# Patient Record
Sex: Female | Born: 1960 | Race: White | Hispanic: No | Marital: Married | Smoking: Never smoker
Health system: Southern US, Community
[De-identification: ages and names within clinical notes are randomized; demographics above are authoritative.]

---

## 2015-06-11 ENCOUNTER — Emergency Department
Admission: EM | Admit: 2015-06-11 | Discharge: 2015-06-11 | Disposition: A | Discharge status: Home / Self Care | Attending: Emergency Medicine | Admitting: Emergency Medicine

## 2015-06-11 ENCOUNTER — Encounter: Payer: Self-pay | Admitting: Emergency Medicine

## 2015-06-11 ENCOUNTER — Emergency Department

## 2015-06-11 DIAGNOSIS — Y998 Other external cause status: Secondary | ICD-10-CM | POA: Insufficient documentation

## 2015-06-11 DIAGNOSIS — S8262XA Displaced fracture of lateral malleolus of left fibula, initial encounter for closed fracture: Secondary | ICD-10-CM

## 2015-06-11 DIAGNOSIS — W090XXA Fall on or from playground slide, initial encounter: Secondary | ICD-10-CM | POA: Diagnosis not present

## 2015-06-11 DIAGNOSIS — S99911A Unspecified injury of right ankle, initial encounter: Secondary | ICD-10-CM | POA: Diagnosis present

## 2015-06-11 DIAGNOSIS — Y9389 Activity, other specified: Secondary | ICD-10-CM | POA: Diagnosis not present

## 2015-06-11 DIAGNOSIS — Y9289 Other specified places as the place of occurrence of the external cause: Secondary | ICD-10-CM | POA: Insufficient documentation

## 2015-06-11 DIAGNOSIS — S8264XA Nondisplaced fracture of lateral malleolus of right fibula, initial encounter for closed fracture: Secondary | ICD-10-CM | POA: Insufficient documentation

## 2015-06-11 MED ORDER — OXYCODONE-ACETAMINOPHEN 5-325 MG PO TABS: 1.0000 | ORAL_TABLET | ORAL | Status: AC | PRN

## 2015-06-11 MED ORDER — OXYCODONE-ACETAMINOPHEN 5-325 MG PO TABS
2.0000 | ORAL_TABLET | Freq: Once | ORAL | Status: AC
  Administered 2015-06-11: 2 via ORAL
  Filled 2015-06-11: qty 2

## 2015-06-11 NOTE — ED Notes (Signed)
States she was at playground and had injury to right ankle  Positive swelling ? deformity

## 2015-06-11 NOTE — ED Provider Notes (Signed)
[facility] Department Provider Note  ____________________________________________  Time seen: Approximately 12:10 PM  I have reviewed the triage vital signs and the nursing notes.   HISTORY  Chief Complaint Ankle Pain    HPI Ariana Lambert is a 54 y.o. female who presents for evaluation of right ankle pain after falling down the slide at the playground with her grandson. Patient complains of swelling and inability to walk on the right ankle.   History reviewed. No pertinent past medical history.  There are no active problems to display for this patient.   History reviewed. No pertinent past surgical history.  Current Outpatient Rx  Name  Route  Sig  Dispense  Refill  . oxyCODONE-acetaminophen (ROXICET) 5-325 MG tablet   Oral   Take 1-2 tablets by mouth every 4 (four) hours as needed for severe pain.   15 tablet   0     Allergies Review of patient's allergies indicates not on file.  No family history on file.  Social History Social History  Substance Use Topics  . Smoking status: Never Smoker   . Smokeless tobacco: None  . Alcohol Use: No    Review of Systems Constitutional: No fever/chills Eyes: No visual changes. ENT: No sore throat. Cardiovascular: Denies chest pain. Respiratory: Denies shortness of breath. Gastrointestinal: No abdominal pain.  No nausea, no vomiting.  No diarrhea.  No constipation. Genitourinary: Negative for dysuria. Musculoskeletal: Positive for right ankle swelling Skin: Negative for rash. Neurological: Negative for headaches, focal weakness or numbness.  10-point ROS otherwise negative.  ____________________________________________   PHYSICAL EXAM:  VITAL SIGNS: ED Triage Vitals  Enc Vitals Group     BP --      Pulse --      Resp --      Temp --      Temp src --      SpO2 --      Weight --      Height --      Head Cir --      Peak Flow --      Pain Score 06/11/15 1202 9     Pain  Loc --      Pain Edu? --      Excl. in GC? --     Constitutional: Alert and oriented. Well appearing and in no acute distress. Cardiovascular: Normal rate, regular rhythm. Grossly normal heart sounds.  Good peripheral circulation. Respiratory: Normal respiratory effort.  No retractions. Lungs CTAB. Musculoskeletal: Right ankle distal neurovascularly intact with lateral edema. Increased pain with flexion and extension. Neurologic:  Normal speech and language. No gross focal neurologic deficits are appreciated. No gait instability. Skin:  Skin is warm, dry and intact. No rash noted. Psychiatric: Mood and affect are normal. Speech and behavior are normal.  ____________________________________________   LABS (all labs ordered are listed, but only abnormal results are displayed)  Labs Reviewed - No data to display ____________________________________________  RADIOLOGY  Nondisplaced fracture within the lateral malleolus with overlying soft tissue swelling. ____________________________________________   PROCEDURES  Procedure(s) performed: None  Critical Care performed: No  ____________________________________________   INITIAL IMPRESSION / ASSESSMENT AND PLAN / ED COURSE  Pertinent labs & imaging results that were available during my care of the patient were reviewed by me and considered in my medical decision making (see chart for details).  Distal fibular fracture. Rx given for Percocet 5/325 ankle splint placed along with Ace wrap and crutches. Patient follow-up [place]opedics back [place] in  [place]. Patient voices no other emergency medical complaints at this time. ____________________________________________   FINAL CLINICAL IMPRESSION(S) / ED DIAGNOSES  Final diagnoses:  Lateral malleolar fracture, left, closed, initial encounter      Evangeline Dakinharles M Beers, PA-C 06/11/15 1245  Phineas SemenGraydon Goodman, MD 06/11/15 1330

## 2015-06-11 NOTE — ED Notes (Signed)
Pt states she was playing on the slide with her grandson and felt her body kept going down the slide while her foot stayed, left ankle swelling noted, sensation intact, foot warm and dry, pt states she was able to walk on her leg and drive home from the playground

## 2015-06-11 NOTE — Discharge Instructions (Signed)
Ankle Fracture A fracture is a break in a bone. The ankle joint is made up of three bones. These include the lower (distal)sections of your lower leg bones, called the tibia and fibula, along with a bone in your foot, called the talus. Depending on how bad the break is and if more than one ankle joint bone is broken, a cast or splint is used to protect and keep your injured bone from moving while it heals. Sometimes, surgery is required to help the fracture heal properly.  There are two general types of fractures:  Stable fracture. This includes a single fracture line through one bone, with no injury to ankle ligaments. A fracture of the talus that does not have any displacement (movement of the bone on either side of the fracture line) is also stable.  Unstable fracture. This includes more than one fracture line through one or more bones in the ankle joint. It also includes fractures that have displacement of the bone on either side of the fracture line. CAUSES  A direct blow to the ankle.   Quickly and severely twisting your ankle.  Trauma, such as a car accident or falling from a significant height. RISK FACTORS You may be at a higher risk of ankle fracture if:  You have certain medical conditions.  You are involved in high-impact sports.  You are involved in a high-impact car accident. SIGNS AND SYMPTOMS   Tender and swollen ankle.  Bruising around the injured ankle.  Pain on movement of the ankle.  Difficulty walking or putting weight on the ankle.  A cold foot below the site of the ankle injury. This can occur if the blood vessels passing through your injured ankle were also damaged.  Numbness in the foot below the site of the ankle injury. DIAGNOSIS  An ankle fracture is usually diagnosed with a physical exam and X-rays. A CT scan may also be required for complex fractures. TREATMENT  Stable fractures are treated with a cast or splint and using crutches to avoid putting  weight on your injured ankle. This is followed by an ankle strengthening program. Some patients require a special type of cast, depending on other medical problems they may have. Unstable fractures require surgery to ensure the bones heal properly. Your health care provider will tell you what type of fracture you have and the best treatment for your condition. HOME CARE INSTRUCTIONS   Review correct crutch use with your health care provider and use your crutches as directed. Safe use of crutches is extremely important. Misuse of crutches can cause you to fall or cause injury to nerves in your hands or armpits.  Do not put weight or pressure on the injured ankle until directed by your health care provider.  To lessen the swelling, keep the injured leg elevated while sitting or lying down.  Apply ice to the injured area:  Put ice in a plastic bag.  Place a towel between your cast and the bag.  Leave the ice on for 20 minutes, 2-3 times a day.  If you have a plaster or fiberglass cast:  Do not try to scratch the skin under the cast with any objects. This can increase your risk of skin infection.  Check the skin around the cast every day. You may put lotion on any red or sore areas.  Keep your cast dry and clean.  If you have a plaster splint:  Wear the splint as directed.  You may loosen the elastic   around the splint if your toes become numb, tingle, or turn cold or blue.  Do not put pressure on any part of your cast or splint; it may break. Rest your cast only on a pillow the first 24 hours until it is fully hardened.  Your cast or splint can be protected during bathing with a plastic bag sealed to your skin with medical tape. Do not lower the cast or splint into water.  Take medicines as directed by your health care provider. Only take over-the-counter or prescription medicines for pain, discomfort, or fever as directed by your health care provider.  Do not drive a vehicle until  your health care provider specifically tells you it is safe to do so.  If your health care provider has given you a follow-up appointment, it is very important to keep that appointment. Not keeping the appointment could result in a chronic or permanent injury, pain, and disability. If you have any problem keeping the appointment, call the facility for assistance. SEEK MEDICAL CARE IF: You develop increased swelling or discomfort. SEEK IMMEDIATE MEDICAL CARE IF:   Your cast gets damaged or breaks.  You have continued severe pain.  You develop new pain or swelling after the cast was put on.  Your skin or toenails below the injury turn blue or gray.  Your skin or toenails below the injury feel cold, numb, or have loss of sensitivity to touch.  There is a bad smell or pus draining from under the cast. MAKE SURE YOU:   Understand these instructions.  Will watch your condition.  Will get help right away if you are not doing well or get worse.   This information is not intended to replace advice given to you by your health care provider. Make sure you discuss any questions you have with your health care provider.   Document Released: 07/26/2000 Document Revised: 08/03/2013 Document Reviewed: 02/25/2013 [employer] Interactive Patient Education 2016 [employer]  

## 2016-11-11 IMAGING — CR DG ANKLE COMPLETE 3+V*R*
1 series · 3 of 3 positions shown · non-contrast
Comparison: None.

CLINICAL DATA: Right foot injury, right ankle pain and swelling,
limited range of motion.

EXAM:
RIGHT ANKLE - COMPLETE 3+ VIEW

[Series 1: ap · 0.17mm/px · 3 of 3 slices shown]
[im 1/3]
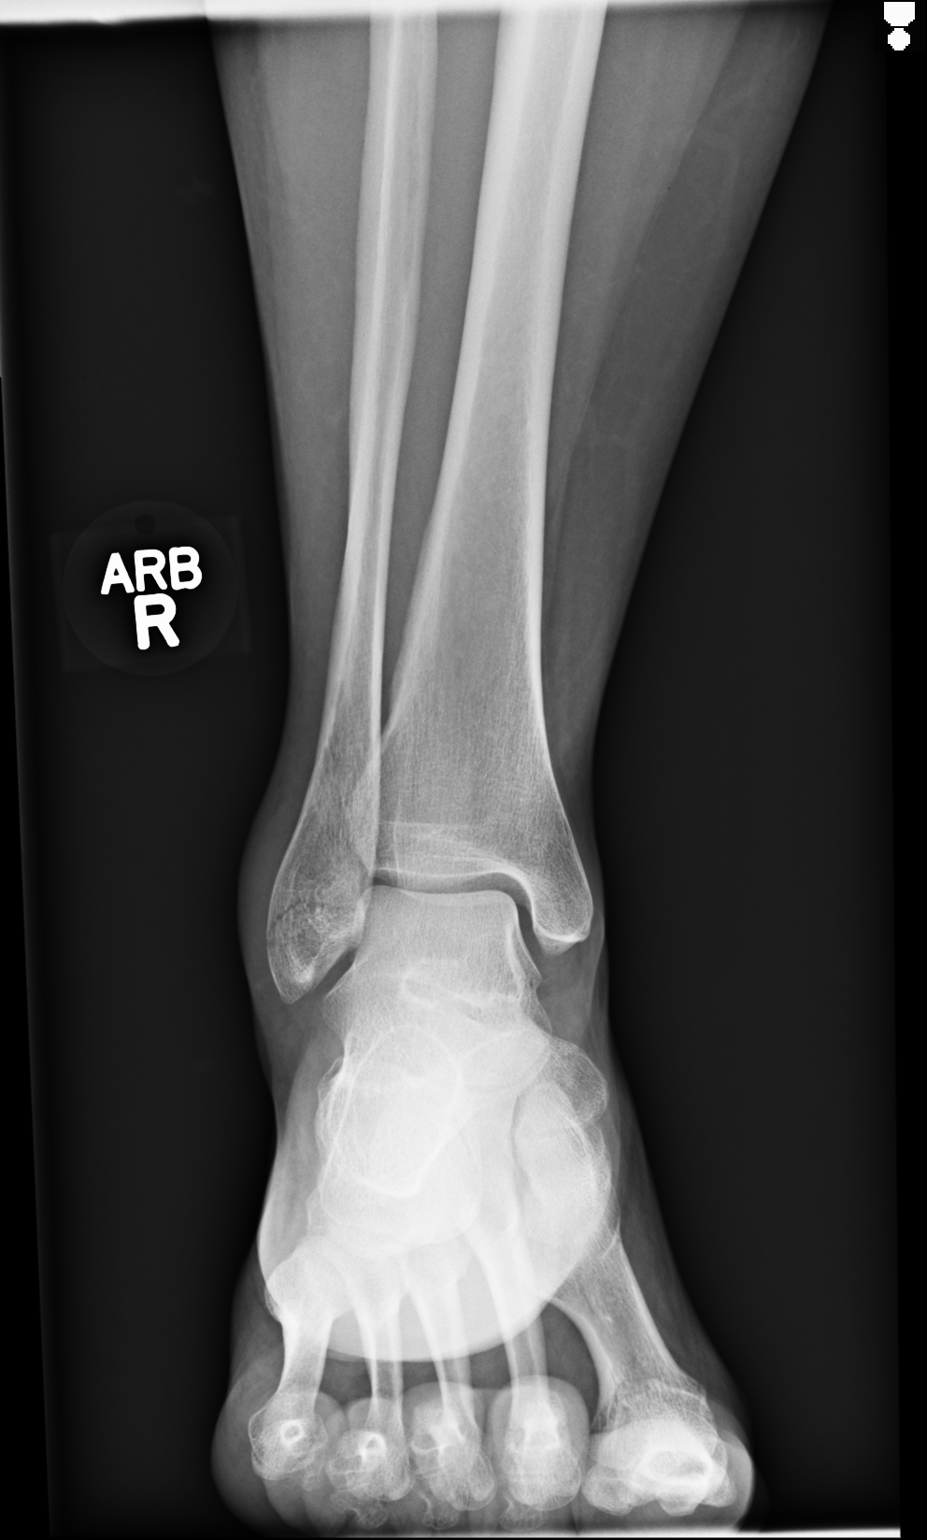
[im 2/3]
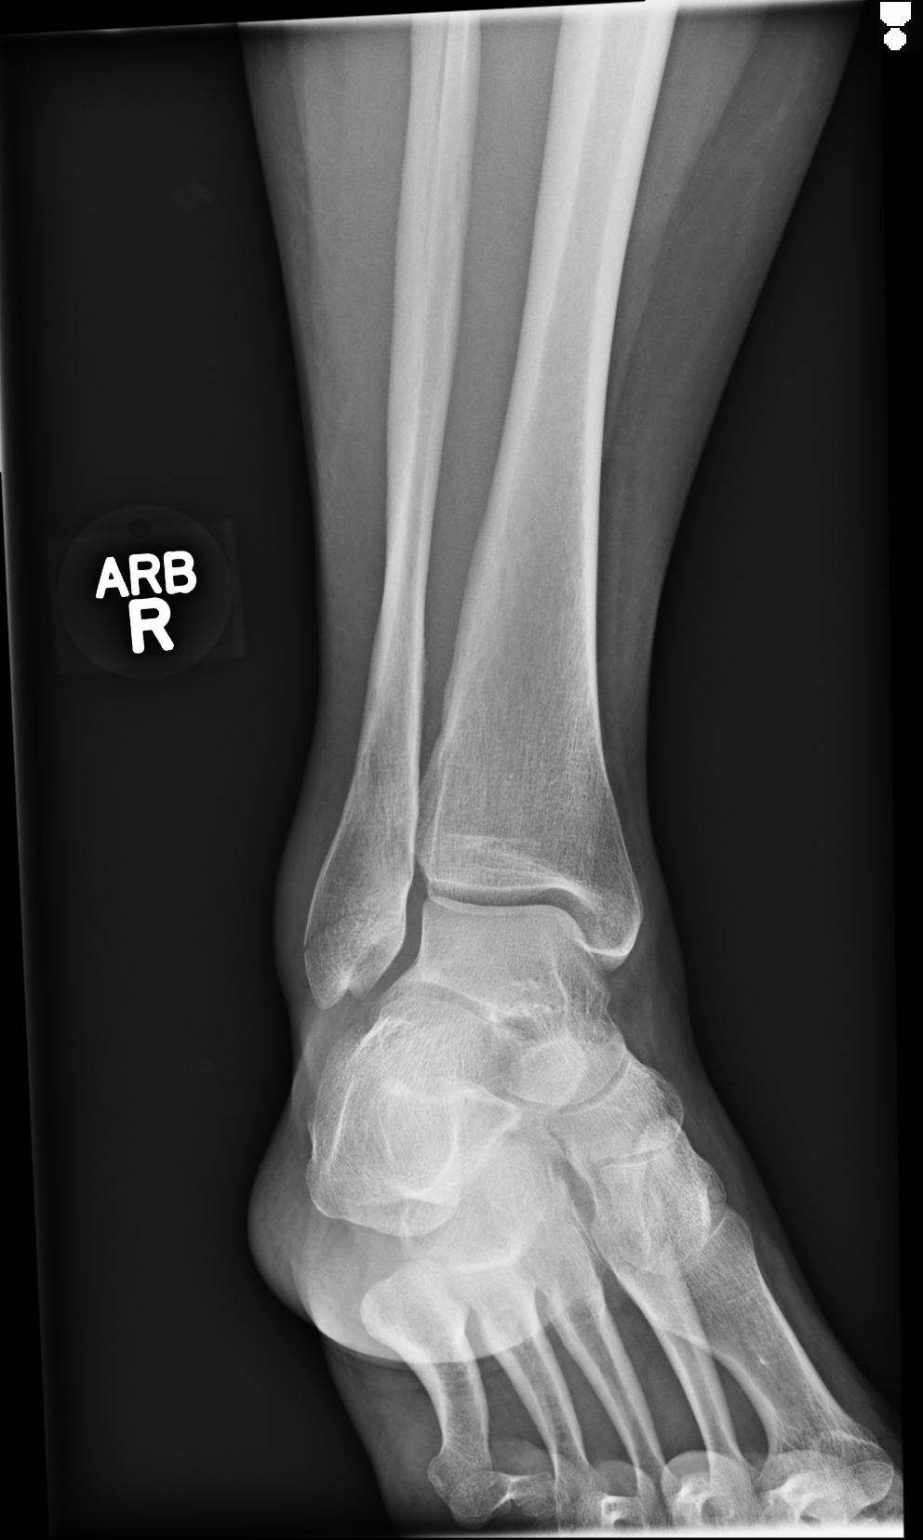
[im 3/3]
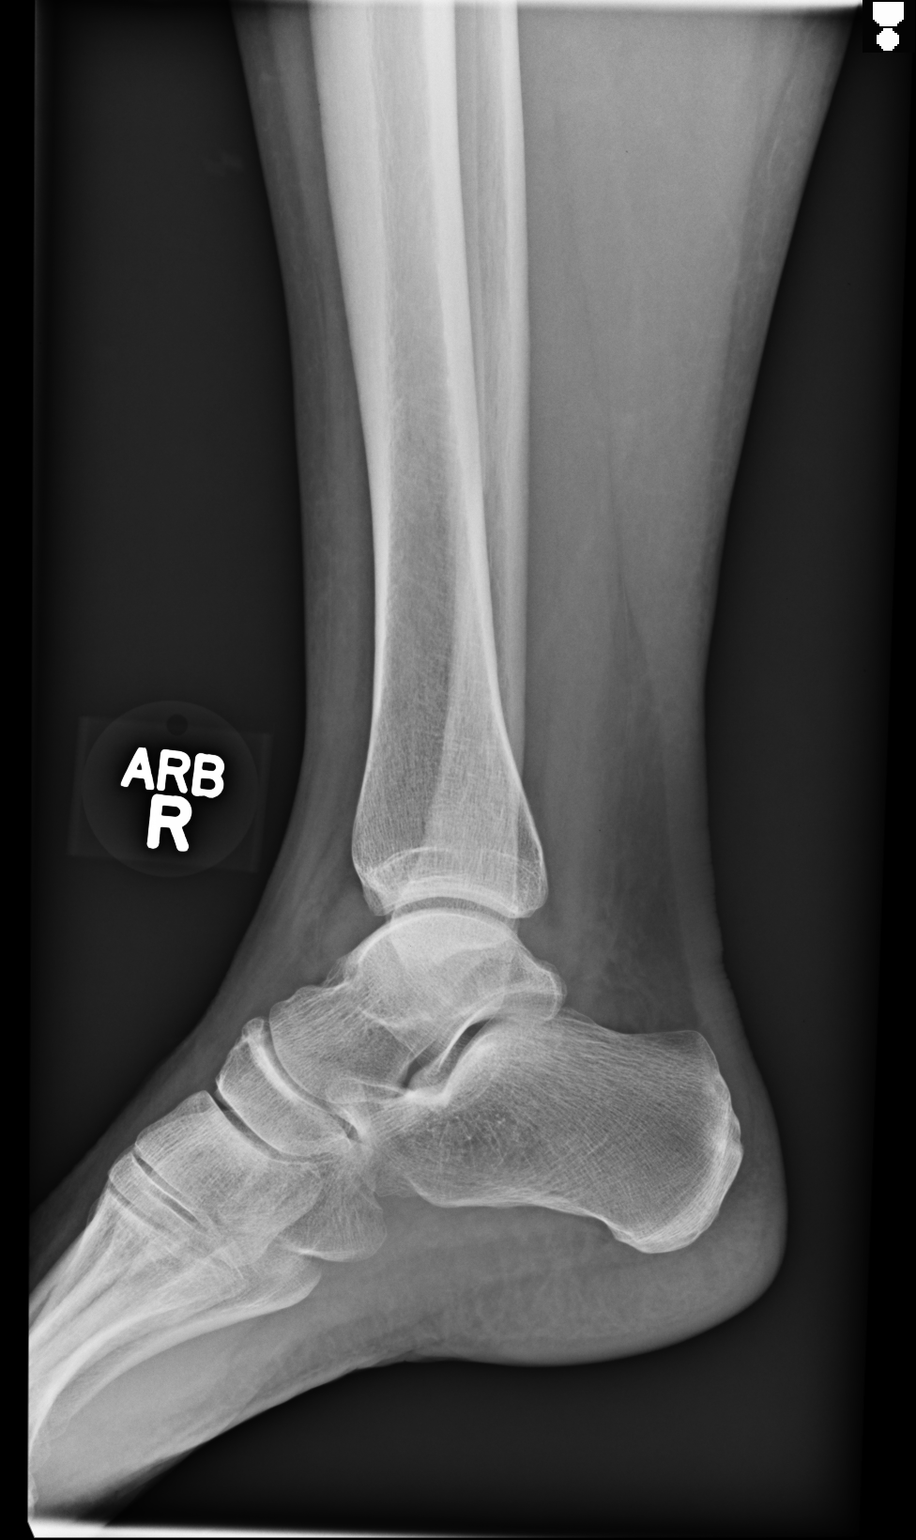

[3 of 3 positions shown; findings below may reference images not displayed]

FINDINGS: There is a nondisplaced fracture within the lateral malleolus with
overlying soft tissue swelling. No fracture seen within the distal
right tibia. Ankle mortise is symmetric and talar dome appears
intact. Visualized portions of the midfoot and hindfoot appear
intact and well aligned.
IMPRESSION: Nondisplaced fracture within the lateral malleolus with overlying
soft tissue swelling.
